# Patient Record
Sex: Male | Born: 1985 | Race: White | Hispanic: No | Marital: Single | Smoking: Current every day smoker
Health system: Southern US, Community
[De-identification: ages and names within clinical notes are randomized; demographics above are authoritative.]

## PROBLEM LIST (undated history)

## (undated) DIAGNOSIS — R011 Cardiac murmur, unspecified: Secondary | ICD-10-CM

---

## 2011-01-19 ENCOUNTER — Emergency Department (HOSPITAL_BASED_OUTPATIENT_CLINIC_OR_DEPARTMENT_OTHER)
Admission: EM | Admit: 2011-01-19 | Discharge: 2011-01-19 | Disposition: A | Discharge status: Home / Self Care | Payer: Self-pay | Attending: Emergency Medicine | Admitting: Emergency Medicine

## 2011-01-19 DIAGNOSIS — J029 Acute pharyngitis, unspecified: Secondary | ICD-10-CM | POA: Insufficient documentation

## 2011-07-28 DIAGNOSIS — B86 Scabies: Secondary | ICD-10-CM | POA: Insufficient documentation

## 2011-07-29 ENCOUNTER — Encounter: Payer: Self-pay | Admitting: *Deleted

## 2011-07-29 ENCOUNTER — Emergency Department (HOSPITAL_BASED_OUTPATIENT_CLINIC_OR_DEPARTMENT_OTHER)
Admission: EM | Admit: 2011-07-29 | Discharge: 2011-07-29 | Disposition: A | Discharge status: Home / Self Care | Payer: Self-pay | Attending: Emergency Medicine | Admitting: Emergency Medicine

## 2011-07-29 DIAGNOSIS — B86 Scabies: Secondary | ICD-10-CM

## 2011-07-29 MED ORDER — PERMETHRIN 5 % EX CREA: TOPICAL_CREAM | CUTANEOUS | Status: AC

## 2011-07-29 NOTE — ED Notes (Signed)
Pt c/o rash that has gotten progressively worse over the last 2-3 weeks.

## 2011-07-29 NOTE — ED Provider Notes (Signed)
History     CSN: [id number] Arrival date & time: 07/29/2011 12:18 AM   First MD Initiated Contact with Patient 07/29/11 0203      Chief Complaint  Patient presents with  . Rash    (Consider location/radiation/quality/duration/timing/severity/associated sxs/prior treatment) HPI  Itching and rash began 3-4 weeks ago.  Symptoms in folds of knees elbows and around waist.  No other symptoms presnet.  GF with same.   History reviewed. No pertinent past medical history.  History reviewed. No pertinent past surgical history.  No family history on file.  History  Substance Use Topics  . Smoking status: Never Smoker   . Smokeless tobacco: Not on file  . Alcohol Use: No      Review of Systems  All other systems reviewed and are negative.    Allergies  Review of patient's allergies indicates no known allergies.  Home Medications  No current outpatient prescriptions on file.  BP 140/86  Pulse 68  Temp(Src) 98.6 F (37 C) (Oral)  Resp 16  SpO2 100%  Physical Exam  Constitutional: He appears well-developed and well-nourished.  HENT:  Head: Normocephalic and atraumatic.  Skin:       Linear red areas between fingers, some eythematous areas all extremities.    ED Course  Procedures (including critical care time)  Labs Reviewed - No data to display No results found.   No diagnosis found.    MDM         Hilario Quarry, MD 07/29/11 [phone]

## 2011-11-29 ENCOUNTER — Emergency Department (HOSPITAL_BASED_OUTPATIENT_CLINIC_OR_DEPARTMENT_OTHER)
Admission: EM | Admit: 2011-11-29 | Discharge: 2011-11-29 | Disposition: A | Discharge status: Home / Self Care | Payer: Self-pay | Attending: Emergency Medicine | Admitting: Emergency Medicine

## 2011-11-29 ENCOUNTER — Encounter (HOSPITAL_BASED_OUTPATIENT_CLINIC_OR_DEPARTMENT_OTHER): Payer: Self-pay | Admitting: *Deleted

## 2011-11-29 DIAGNOSIS — X58XXXA Exposure to other specified factors, initial encounter: Secondary | ICD-10-CM | POA: Insufficient documentation

## 2011-11-29 DIAGNOSIS — S0500XA Injury of conjunctiva and corneal abrasion without foreign body, unspecified eye, initial encounter: Secondary | ICD-10-CM

## 2011-11-29 DIAGNOSIS — Y9269 Other specified industrial and construction area as the place of occurrence of the external cause: Secondary | ICD-10-CM | POA: Insufficient documentation

## 2011-11-29 DIAGNOSIS — S058X9A Other injuries of unspecified eye and orbit, initial encounter: Secondary | ICD-10-CM | POA: Insufficient documentation

## 2011-11-29 MED ORDER — CIPROFLOXACIN HCL 0.3 % OP SOLN
2.0000 [drp] | Freq: Once | OPHTHALMIC | Status: AC
  Administered 2011-11-29: 2 [drp] via OPHTHALMIC
  Filled 2011-11-29: qty 2.5

## 2011-11-29 MED ORDER — TETRACAINE HCL 0.5 % OP SOLN
1.0000 [drp] | Freq: Once | OPHTHALMIC | Status: AC
  Administered 2011-11-29: 1 [drp] via OPHTHALMIC
  Filled 2011-11-29: qty 2

## 2011-11-29 MED ORDER — KETOROLAC TROMETHAMINE 0.5 % OP SOLN: 1.0000 [drp] | Freq: Four times a day (QID) | OPHTHALMIC | Status: AC

## 2011-11-29 MED ORDER — FLUORESCEIN SODIUM 1 MG OP STRP
1.0000 | ORAL_STRIP | Freq: Once | OPHTHALMIC | Status: AC
  Administered 2011-11-29: 1 via OPHTHALMIC
  Filled 2011-11-29: qty 1

## 2011-11-29 NOTE — ED Notes (Signed)
Pt states he was weed-eating yesterday and something "flew up into his right eye" Tried flushing it out without relief

## 2011-11-29 NOTE — ED Provider Notes (Signed)
Medical screening examination/treatment/procedure(s) were performed by non-physician practitioner and as supervising physician I was immediately available for consultation/collaboration.   Dayton Bailiff, MD 11/29/11 1515

## 2011-11-29 NOTE — Discharge Instructions (Signed)
Corneal Abrasion The cornea is the clear covering at the front and center of the eye. When looking at the colored portion (iris) of the eye, you are looking through that person's cornea.  This very thin tissue is made up of many layers. The surface layer is a single layer of cells called the corneal epithelium. This is one of the most sensitive tissues in the body. If a scratch or injury causes the corneal epithelium to come off, it is called a corneal abrasion. If the injury extends to the tissues below the epithelium, the condition is called a corneal ulcer.  CAUSES   Scratches.   Trauma.   Foreign body in the eye.   Some people have recurrences of abrasions in the area of the original injury even after they heal. This is called recurrent erosion syndrome. Recurrent erosion syndromes generally improve and go away with time.  SYMPTOMS   Eye pain.   Difficulty or inability to keep the injured eye open.   The eye becomes very sensitive to light.   Recurrent erosions tend to happen suddenly, first thing in the morning - usually upon awakening and opening the eyes.  DIAGNOSIS  Your eye professional can diagnose a corneal abrasion during an eye exam. Dye is usually placed in the eye using a drop or a small paper strip moistened by the patient's tears. When the eye is examined with a special light, the abrasion shows up clearly because of the dye. TREATMENT   Small abrasions may be treated with antibiotic drops or ointment alone.   Usually a pressure patch is specially applied. Pressure patches prevent the eye from blinking, allowing the corneal epithelium to heal. Because blinking is less, a pressure patch also reduces the amount of pain present in the eye during healing. Most corneal abrasions heal within 2-3 days with no effect on vision. WARNING: Do not drive or operate machinery while your eye is patched. Your ability to judge distances is impaired.   If abrasion becomes infected and  spreads to the deeper tissues of the cornea, a corneal ulcer can result. This is serious because it can cause corneal scarring. Corneal scars interfere with light passing through the cornea, and cause a loss of vision in the involved eye.   If your caregiver has given you a follow-up appointment, it is very important to keep that appointment. Not keeping the appointment could result in a severe eye infection or permanent loss of vision. If there is any problem keeping the appointment, you must call back to this facility for assistance.  SEEK MEDICAL CARE IF:   You have pain, light sensitivity and a scratchy feeling in one eye (or both).   Your pressure patch keeps loosening up and you can blink your eye under the patch after treatment.   Any kind of discharge develops from the involved eye after treatment or if the lids stick together in the morning.   You have the same symptoms in the morning as you did with the original abrasion days, weeks or months after the abrasion healed.  MAKE SURE YOU:   Understand these instructions.   Will watch your condition.   Will get help right away if you are not doing well or get worse.  Document Released: 08/07/2000 Document Revised: 07/30/2011 Document Reviewed: 03/15/2008 ExitCare Patient Information 2012 [employer]. 

## 2011-11-29 NOTE — ED Provider Notes (Signed)
History     CSN: [id number]  Arrival date & time 11/29/11  1332   First MD Initiated Contact with Patient 11/29/11 1409      Chief Complaint  Patient presents with  . Foreign Body in Eye    (Consider location/radiation/quality/duration/timing/severity/associated sxs/prior treatment) HPI  Pt presents to the ED with complaints of scratching his eye at work yesterday. He states that he was mowing the lawn when he feels as though grass got under his goggles and in his eye. He flushed his eye for a long time with normal saline but his eye is still red and irritated. He denies change in vision or sensitivity to light. He admits to his eye itching. He denies fevers, chills, LOC, lethargy or being unable to move his eye.  History reviewed. No pertinent past medical history.  History reviewed. No pertinent past surgical history.  History reviewed. No pertinent family history.  History  Substance Use Topics  . Smoking status: Never Smoker   . Smokeless tobacco: Not on file  . Alcohol Use: No      Review of Systems  All other systems reviewed and are negative.    Allergies  Review of patient's allergies indicates no known allergies.  Home Medications   Current Outpatient Rx  Name Route Sig Dispense Refill  . KETOROLAC TROMETHAMINE 0.5 % OP SOLN Both Eyes Place 1 drop into both eyes every 6 (six) hours. 3 mL 0    BP 132/76  Pulse 102  Temp(Src) 97.8 F (36.6 C) (Oral)  Resp 18  Ht 5\' 8"  (1.727 m)  Wt 150 lb (68.04 kg)  BMI 22.81 kg/m2  SpO2 100%  Physical Exam  Nursing note and vitals reviewed. Constitutional: He appears well-developed and well-nourished. No distress.  HENT:  Head: Normocephalic and atraumatic.  Eyes: EOM and lids are normal. Pupils are equal, round, and reactive to light. No foreign bodies found. Right eye exhibits no chemosis, no discharge, no exudate and no hordeolum. No foreign body present in the right eye. Left eye exhibits no chemosis, no  discharge, no exudate and no hordeolum. No foreign body present in the left eye. Right conjunctiva is injected. Right conjunctiva has no hemorrhage. Left conjunctiva is not injected. Left conjunctiva has no hemorrhage.  Slit lamp exam:      The right eye shows corneal abrasion.         Small corneal abrasion noted with conjunctival injection. Eye lids swept for foriegn bodies with no findings.  Neck: Normal range of motion. Neck supple.  Cardiovascular: Normal rate and regular rhythm.   Pulmonary/Chest: Effort normal.  Abdominal: Soft.  Neurological: He is alert.  Skin: Skin is warm and dry.    ED Course  Procedures (including critical care time)  Labs Reviewed - No data to display No results found.   1. Corneal abrasion       MDM  Pt given Cipro eye drops in ED and an Rx for Ketorolac eye drops. Vision is unchanged from baseline  Also given a referral to Ophthalmology to be seen tomorrow or Tuesday.  Pt has been advised of the symptoms that warrant their return to the ED. Patient has voiced understanding and has agreed to follow-up with the PCP or specialist.         Dorthula Matas, PA 11/29/11 [phone]

## 2013-12-17 ENCOUNTER — Emergency Department (HOSPITAL_BASED_OUTPATIENT_CLINIC_OR_DEPARTMENT_OTHER)
Admission: EM | Admit: 2013-12-17 | Discharge: 2013-12-17 | Disposition: A | Discharge status: Home / Self Care | Payer: Self-pay | Attending: Emergency Medicine | Admitting: Emergency Medicine

## 2013-12-17 ENCOUNTER — Emergency Department (HOSPITAL_BASED_OUTPATIENT_CLINIC_OR_DEPARTMENT_OTHER): Payer: Self-pay

## 2013-12-17 ENCOUNTER — Encounter (HOSPITAL_BASED_OUTPATIENT_CLINIC_OR_DEPARTMENT_OTHER): Payer: Self-pay | Admitting: Emergency Medicine

## 2013-12-17 DIAGNOSIS — Y929 Unspecified place or not applicable: Secondary | ICD-10-CM | POA: Insufficient documentation

## 2013-12-17 DIAGNOSIS — Y939 Activity, unspecified: Secondary | ICD-10-CM | POA: Insufficient documentation

## 2013-12-17 DIAGNOSIS — X500XXA Overexertion from strenuous movement or load, initial encounter: Secondary | ICD-10-CM | POA: Insufficient documentation

## 2013-12-17 DIAGNOSIS — F17200 Nicotine dependence, unspecified, uncomplicated: Secondary | ICD-10-CM | POA: Insufficient documentation

## 2013-12-17 DIAGNOSIS — S82899A Other fracture of unspecified lower leg, initial encounter for closed fracture: Secondary | ICD-10-CM | POA: Insufficient documentation

## 2013-12-17 DIAGNOSIS — S82409A Unspecified fracture of shaft of unspecified fibula, initial encounter for closed fracture: Secondary | ICD-10-CM

## 2013-12-17 MED ORDER — HYDROCODONE-ACETAMINOPHEN 5-325 MG PO TABS: 2.0000 | ORAL_TABLET | ORAL | Status: DC | PRN

## 2013-12-17 MED ORDER — HYDROCODONE-ACETAMINOPHEN 5-325 MG PO TABS
1.0000 | ORAL_TABLET | Freq: Once | ORAL | Status: AC
  Administered 2013-12-17: 1 via ORAL
  Filled 2013-12-17: qty 1

## 2013-12-17 NOTE — ED Notes (Signed)
Reports 'twisting' right ankle on Friday.  Pain, swelling worsening.  Presents on crutches he borrowed from a friend. States pain is worse on the lateral aspect of the ankle.

## 2013-12-17 NOTE — Discharge Instructions (Signed)
Fibular Fracture, Ankle, Adult, Undisplaced, Treated With Immobilization °A simple fracture of the bone below the knee on the outside of your leg (fibula) usually heals without problems. °CAUSES °Typically, a fibular fracture occurs as a result of trauma. A blow to the side of your leg or a powerful twisting movement can cause a fracture. Fibular fractures are often seen as a result of football, soccer, or skiing injuries. °SYMPTOMS °Symptoms of a fibular fracture can include: °· Pain. °· Shortening or abnormal alignment of your lower leg (angulation). °DIAGNOSIS °A health care provider will need to examine the leg. X-ray exams will be ordered for further to confirm the fracture and evaluate the extent and of the injury. °TREATMENT  °Typically, a cast or immobilizer is applied. Sometimes a splint is placed on these fractures if it is needed for comfort or if the bones are badly out of place. Crutches may be needed to help you get around.  °HOME CARE INSTRUCTIONS  °· Apply ice to the injured area: °· Put ice in a plastic bag. °· Place a towel between your skin and the bag. °· Leave the ice on for 20 minutes, 2 3 times a day. °· Use crutches as directed. Resume walking without crutches as directed by your health care provider or when comfortable doing so. °· Only take over-the-counter or prescription medicines for pain, discomfort, or fever as directed by your health care provider. °· Keeping your leg raised may lessen swelling. °· If you have a removable splint or boot, do not remove the boot unless directed by your health care provider. °· Do not not drive a car or operate a motor vehicle until your health care provider specifically tells you it is safe to do so. °SEEK IMMEDIATE MEDICAL CARE IF:  °· Your cast gets damaged or breaks. °· You have continued severe pain or more swelling than you did before the cast was put on, or the pain is not controlled with medications. °· Your skin or nails below the injury turn  blue or grey, or feel cold or numb. °· There is a bad smell or pus coming from under the cast. °· You develop severe pain in ankle or foot. °MAKE SURE YOU:  °· Understand these instructions. °· Will watch your condition. °· Will get help right away if you are not doing well or get worse. °Document Released: 05/02/2002 Document Revised: 05/31/2013 Document Reviewed: 03/22/2013 °ExitCare® Patient Information ©2014 [employer]. ° °

## 2013-12-17 NOTE — ED Provider Notes (Signed)
CSN: [id number]     Arrival date & time 12/17/13  1423 History   First MD Initiated Contact with Patient 12/17/13 1434     Chief Complaint  Patient presents with  . Ankle Pain     HPI  Patient twisted her ankle on Friday inverted it. Painful swollen ecchymotic. Painful to bear weight presents here. Is been walking on crutches from his friend  History reviewed. No pertinent past medical history. History reviewed. No pertinent past surgical history. No family history on file. History  Substance Use Topics  . Smoking status: Current Every Day Smoker  . Smokeless tobacco: Not on file  . Alcohol Use: Yes    Review of Systems  Musculoskeletal: Positive for arthralgias and joint swelling.  Skin: Positive for color change.      Allergies  Review of patient's allergies indicates no known allergies.  Home Medications   Prior to Admission medications   Medication Sig Start Date End Date Taking? Authorizing Provider  HYDROcodone-acetaminophen (NORCO/VICODIN) 5-325 MG per tablet Take 2 tablets by mouth every 4 (four) hours as needed. 12/17/13   Rolland PorterMark Joaquim Tolen, MD   BP 159/90  Pulse 107  Temp(Src) 98 F (36.7 C) (Oral)  Resp 20  Ht 5\' 8"  (1.727 m)  Wt 180 lb (81.647 kg)  BMI 27.38 kg/m2  SpO2 98% Physical Exam  Musculoskeletal:       Feet:  Soft tissue swelling and ecchymosis over the lateral malleolus. Extends several centimeters up the lateral aspect of the fibula.    ED Course  Procedures (including critical care time) Labs Review Labs Reviewed - No data to display  Imaging Review Dg Tibia/fibula Right  12/17/2013   CLINICAL DATA:  Twisted ankle Friday  EXAM: RIGHT TIBIA AND FIBULA - 2 VIEW  COMPARISON:  Concurrently obtained radiographs of the ankle  FINDINGS: Obliquely oriented fracture through the distal fibula better demonstrated on concurrently obtained radiographs of the ankle. The mid and proximal fibula are intact. The knee joint appears unremarkable. No joint  effusion. No acute abnormality of the tibia.  IMPRESSION: 1. Obliquely oriented fracture through the distal fibula. No proximal fibular fracture or malalignment.   Electronically Signed   By: Malachy MoanHeath  McCullough M.D.   On: 12/17/2013 14:58   Dg Ankle Complete Right  12/17/2013   CLINICAL DATA:  Twisting injury with pain  EXAM: RIGHT ANKLE - COMPLETE 3+ VIEW  COMPARISON:  None.  FINDINGS: There is a nondisplaced oblique fracture of the distal fibular metaphysis. No other regional fracture.  IMPRESSION: Nondisplaced oblique fracture of the distal fibular metaphysis.   Electronically Signed   By: Paulina FusiMark  Shogry M.D.   On: 12/17/2013 14:58     EKG Interpretation None      MDM   Final diagnoses:  Fibula fracture    NWB.  Orhto f/u.  Elevate.  Crutches.  [place] prn.    Rolland PorterMark Gertrude Bucks, MD 12/17/13 1515

## 2014-01-05 ENCOUNTER — Encounter (HOSPITAL_COMMUNITY): Payer: Self-pay | Admitting: Pharmacy Technician

## 2014-01-09 ENCOUNTER — Encounter (HOSPITAL_COMMUNITY)
Admission: RE | Admit: 2014-01-09 | Discharge: 2014-01-09 | Disposition: A | Discharge status: Home / Self Care | Payer: Self-pay | Source: Ambulatory Visit | Attending: Orthopedic Surgery | Admitting: Orthopedic Surgery

## 2014-01-09 ENCOUNTER — Encounter (HOSPITAL_COMMUNITY): Payer: Self-pay

## 2014-01-09 DIAGNOSIS — Z01812 Encounter for preprocedural laboratory examination: Secondary | ICD-10-CM | POA: Insufficient documentation

## 2014-01-09 HISTORY — DX: Cardiac murmur, unspecified: R01.1

## 2014-01-09 LAB — BASIC METABOLIC PANEL
BUN: 15 mg/dL (ref 6–23)
CHLORIDE: 103 meq/L (ref 96–112)
CO2: 24 mEq/L (ref 19–32)
CREATININE: 0.89 mg/dL (ref 0.50–1.35)
Calcium: 9.7 mg/dL (ref 8.4–10.5)
GLUCOSE: 90 mg/dL (ref 70–99)
POTASSIUM: 4.3 meq/L (ref 3.7–5.3)
Sodium: 140 mEq/L (ref 137–147)

## 2014-01-09 LAB — CBC
HEMATOCRIT: 44.9 % (ref 39.0–52.0)
HEMOGLOBIN: 15.2 g/dL (ref 13.0–17.0)
MCH: 30.3 pg (ref 26.0–34.0)
MCHC: 33.9 g/dL (ref 30.0–36.0)
MCV: 89.6 fL (ref 78.0–100.0)
Platelets: 183 10*3/uL (ref 150–400)
RBC: 5.01 MIL/uL (ref 4.22–5.81)
RDW: 13.1 % (ref 11.5–15.5)
WBC: 7.7 10*3/uL (ref 4.0–10.5)

## 2014-01-09 NOTE — Progress Notes (Signed)
REQUESTED PREOP ORDERS FROM DR. HEWITT'S OFFICE.

## 2014-01-09 NOTE — Pre-Procedure Instructions (Signed)
Buckner MaltaMatthew Bresee  01/09/2014   Your procedure is scheduled on:  Thursday  01/11/14  [facility] at 1245 PM.  Call this number if you have problems the morning of surgery: [phone]   Remember:   Do not eat food or drink liquids after midnight.   Take these medicines the morning of surgery with A SIP OF WATER: HYDROCODONE IF NEEDED   Do not wear jewelry, make-up or nail polish.  Do not wear lotions, powders, or perfumes. You may wear deodorant.  Do not shave 48 hours prior to surgery. Men may shave face and neck.  Do not bring valuables to the hospital.  [facility] not [facility]                  for any belongings or valuables.               Contacts, dentures or bridgework may not be worn into surgery.  Leave suitcase in the car. After surgery it may be brought to your room.  For patients admitted to the hospital, discharge time is determined by your                treatment team.               Patients discharged the day of surgery will not be allowed to drive  home.  Name and phone number of your driver:   Special Instructions:  Special Instructions: [facility] - Preparing for Surgery  Before surgery, you can play an important role.  Because skin is not sterile, your skin needs to be as free of germs as possible.  You can reduce the number of germs on you skin by washing with CHG (chlorahexidine gluconate) soap before surgery.  CHG is an antiseptic cleaner which kills germs and bonds with the skin to continue killing germs even after washing.  Please DO NOT use if you have an allergy to CHG or antibacterial soaps.  If your skin becomes reddened/irritated stop using the CHG and inform your nurse when you arrive at Short Stay.  Do not shave (including legs and underarms) for at least 48 hours prior to the first CHG shower.  You may shave your face.  Please follow these instructions carefully:   1.  Shower with CHG Soap the night before surgery  and the morning of Surgery.  2.  If you choose to wash your hair, wash your hair first as usual with your normal shampoo.  3.  After you shampoo, rinse your hair and body thoroughly to remove the Shampoo.  4.  Use CHG as you would any other liquid soap. You can apply chg directly to the skin and wash gently with scrungie or a clean washcloth.  5.  Apply the CHG Soap to your body ONLY FROM THE NECK DOWN.  Do not use on open wounds or open sores.  Avoid contact with your eyes, ears, mouth and genitals (private parts).  Wash genitals (private parts with your normal soap.  6.  Wash thoroughly, paying special attention to the area where your surgery will be performed.  7.  Thoroughly rinse your body with warm water from the neck down.  8.  DO NOT shower/wash with your normal soap after using and rinsing off the CHG Soap.  9.  Pat yourself dry with a clean towel.            10.  Wear clean pajamas.  11.  Place clean sheets on your bed the night of your first shower and do not sleep with pets.  Day of Surgery  Do not apply any lotions/deodorants the morning of surgery.  Please wear clean clothes to the hospital/surgery center.   Please read over the following fact sheets that you were given: Pain Booklet, Coughing and Deep Breathing, MRSA Information and Surgical Site Infection Prevention

## 2014-01-10 ENCOUNTER — Other Ambulatory Visit: Payer: Self-pay | Admitting: Orthopedic Surgery

## 2014-01-11 ENCOUNTER — Encounter (HOSPITAL_COMMUNITY): Payer: Self-pay | Admitting: Anesthesiology

## 2014-01-11 ENCOUNTER — Ambulatory Visit (HOSPITAL_COMMUNITY)
Admission: RE | Admit: 2014-01-11 | Discharge: 2014-01-11 | Disposition: A | Discharge status: Home / Self Care | Payer: Self-pay | Source: Ambulatory Visit | Attending: Orthopedic Surgery | Admitting: Orthopedic Surgery

## 2014-01-11 ENCOUNTER — Encounter (HOSPITAL_COMMUNITY)
Admission: RE | Disposition: A | Discharge status: Home / Self Care | Payer: Self-pay | Source: Ambulatory Visit | Attending: Orthopedic Surgery

## 2014-01-11 ENCOUNTER — Encounter (HOSPITAL_COMMUNITY): Payer: Self-pay | Admitting: *Deleted

## 2014-01-11 ENCOUNTER — Ambulatory Visit (HOSPITAL_COMMUNITY): Payer: Self-pay

## 2014-01-11 ENCOUNTER — Ambulatory Visit (HOSPITAL_COMMUNITY): Payer: Self-pay | Admitting: Anesthesiology

## 2014-01-11 DIAGNOSIS — F17200 Nicotine dependence, unspecified, uncomplicated: Secondary | ICD-10-CM | POA: Insufficient documentation

## 2014-01-11 DIAGNOSIS — Y929 Unspecified place or not applicable: Secondary | ICD-10-CM | POA: Insufficient documentation

## 2014-01-11 DIAGNOSIS — S8263XA Displaced fracture of lateral malleolus of unspecified fibula, initial encounter for closed fracture: Secondary | ICD-10-CM | POA: Insufficient documentation

## 2014-01-11 DIAGNOSIS — S82891A Other fracture of right lower leg, initial encounter for closed fracture: Secondary | ICD-10-CM

## 2014-01-11 DIAGNOSIS — X500XXA Overexertion from strenuous movement or load, initial encounter: Secondary | ICD-10-CM | POA: Insufficient documentation

## 2014-01-11 HISTORY — PX: ORIF ANKLE FRACTURE: SHX5408

## 2014-01-11 SURGERY — OPEN REDUCTION INTERNAL FIXATION (ORIF) ANKLE FRACTURE
Anesthesia: General | Site: Ankle | Laterality: Right

## 2014-01-11 MED ORDER — FENTANYL CITRATE 0.05 MG/ML IJ SOLN
INTRAMUSCULAR | Status: AC
  Filled 2014-01-11: qty 5

## 2014-01-11 MED ORDER — CEFAZOLIN SODIUM-DEXTROSE 2-3 GM-% IV SOLR
INTRAVENOUS | Status: AC
  Filled 2014-01-11: qty 50

## 2014-01-11 MED ORDER — FENTANYL CITRATE 0.05 MG/ML IJ SOLN
INTRAMUSCULAR | Status: DC | PRN
  Administered 2014-01-11 (×2): 50 ug via INTRAVENOUS
  Administered 2014-01-11 (×3): 100 ug via INTRAVENOUS

## 2014-01-11 MED ORDER — PROPOFOL 10 MG/ML IV BOLUS
INTRAVENOUS | Status: DC | PRN
  Administered 2014-01-11: 300 mg via INTRAVENOUS

## 2014-01-11 MED ORDER — MIDAZOLAM HCL 2 MG/2ML IJ SOLN: 0.5000 mg | Freq: Once | INTRAMUSCULAR | Status: DC | PRN

## 2014-01-11 MED ORDER — CHLORHEXIDINE GLUCONATE 4 % EX LIQD
60.0000 mL | Freq: Once | CUTANEOUS | Status: DC
  Filled 2014-01-11: qty 60

## 2014-01-11 MED ORDER — LACTATED RINGERS IV SOLN
INTRAVENOUS | Status: DC | PRN
  Administered 2014-01-11 (×2): via INTRAVENOUS

## 2014-01-11 MED ORDER — BACITRACIN ZINC 500 UNIT/GM EX OINT
TOPICAL_OINTMENT | CUTANEOUS | Status: DC | PRN
  Administered 2014-01-11: 1 via TOPICAL

## 2014-01-11 MED ORDER — OXYCODONE HCL 5 MG PO TABS
ORAL_TABLET | ORAL | Status: AC
  Filled 2014-01-11: qty 2

## 2014-01-11 MED ORDER — OXYCODONE HCL 5 MG PO TABS
5.0000 mg | ORAL_TABLET | ORAL | Status: DC | PRN
  Administered 2014-01-11: 10 mg via ORAL

## 2014-01-11 MED ORDER — RIVAROXABAN 10 MG PO TABS: 10.0000 mg | ORAL_TABLET | Freq: Every day | ORAL | Status: AC

## 2014-01-11 MED ORDER — OXYCODONE HCL 5 MG PO TABS: 5.0000 mg | ORAL_TABLET | Freq: Once | ORAL | Status: DC | PRN

## 2014-01-11 MED ORDER — LACTATED RINGERS IV SOLN
INTRAVENOUS | Status: DC
  Administered 2014-01-11: 13:00:00 via INTRAVENOUS

## 2014-01-11 MED ORDER — BUPIVACAINE-EPINEPHRINE (PF) 0.5% -1:200000 IJ SOLN
INTRAMUSCULAR | Status: DC | PRN
  Administered 2014-01-11: 20 mL

## 2014-01-11 MED ORDER — PROPOFOL 10 MG/ML IV BOLUS
INTRAVENOUS | Status: AC
  Filled 2014-01-11: qty 20

## 2014-01-11 MED ORDER — SODIUM CHLORIDE 0.9 % IV SOLN: INTRAVENOUS | Status: DC

## 2014-01-11 MED ORDER — OXYCODONE HCL 5 MG/5ML PO SOLN: 5.0000 mg | Freq: Once | ORAL | Status: DC | PRN

## 2014-01-11 MED ORDER — ONDANSETRON HCL 4 MG/2ML IJ SOLN
INTRAMUSCULAR | Status: DC | PRN
  Administered 2014-01-11: 4 mg via INTRAVENOUS

## 2014-01-11 MED ORDER — MEPERIDINE HCL 25 MG/ML IJ SOLN: 6.2500 mg | INTRAMUSCULAR | Status: DC | PRN

## 2014-01-11 MED ORDER — MIDAZOLAM HCL 2 MG/2ML IJ SOLN
INTRAMUSCULAR | Status: AC
  Filled 2014-01-11: qty 2

## 2014-01-11 MED ORDER — ACETAMINOPHEN 500 MG PO TABS
1000.0000 mg | ORAL_TABLET | Freq: Once | ORAL | Status: AC
  Administered 2014-01-11: 1000 mg via ORAL

## 2014-01-11 MED ORDER — PROMETHAZINE HCL 25 MG/ML IJ SOLN: 6.2500 mg | INTRAMUSCULAR | Status: DC | PRN

## 2014-01-11 MED ORDER — BACITRACIN ZINC 500 UNIT/GM EX OINT
TOPICAL_OINTMENT | CUTANEOUS | Status: AC
  Filled 2014-01-11: qty 15

## 2014-01-11 MED ORDER — ONDANSETRON HCL 4 MG/2ML IJ SOLN
INTRAMUSCULAR | Status: AC
  Filled 2014-01-11: qty 2

## 2014-01-11 MED ORDER — LIDOCAINE HCL (CARDIAC) 20 MG/ML IV SOLN
INTRAVENOUS | Status: AC
  Filled 2014-01-11: qty 5

## 2014-01-11 MED ORDER — ROCURONIUM BROMIDE 50 MG/5ML IV SOLN
INTRAVENOUS | Status: AC
  Filled 2014-01-11: qty 1

## 2014-01-11 MED ORDER — ACETAMINOPHEN 325 MG PO TABS
ORAL_TABLET | ORAL | Status: AC
  Administered 2014-01-11: 1000 mg via ORAL
  Filled 2014-01-11: qty 3

## 2014-01-11 MED ORDER — CEFAZOLIN SODIUM-DEXTROSE 2-3 GM-% IV SOLR
2.0000 g | INTRAVENOUS | Status: AC
  Administered 2014-01-11: 2 g via INTRAVENOUS

## 2014-01-11 MED ORDER — MIDAZOLAM HCL 5 MG/5ML IJ SOLN
INTRAMUSCULAR | Status: DC | PRN
  Administered 2014-01-11: 2 mg via INTRAVENOUS

## 2014-01-11 MED ORDER — BUPIVACAINE-EPINEPHRINE (PF) 0.5% -1:200000 IJ SOLN
INTRAMUSCULAR | Status: AC
  Filled 2014-01-11: qty 30

## 2014-01-11 MED ORDER — LIDOCAINE HCL (CARDIAC) 20 MG/ML IV SOLN
INTRAVENOUS | Status: DC | PRN
  Administered 2014-01-11: 80 mg via INTRAVENOUS

## 2014-01-11 MED ORDER — OXYCODONE HCL 5 MG PO TABS: 5.0000 mg | ORAL_TABLET | ORAL | Status: AC | PRN

## 2014-01-11 MED ORDER — HYDROMORPHONE HCL PF 1 MG/ML IJ SOLN
INTRAMUSCULAR | Status: AC
  Filled 2014-01-11: qty 1

## 2014-01-11 MED ORDER — HYDROMORPHONE HCL PF 1 MG/ML IJ SOLN
0.2500 mg | INTRAMUSCULAR | Status: DC | PRN
  Administered 2014-01-11: 1 mg via INTRAVENOUS

## 2014-01-11 SURGICAL SUPPLY — 64 items
BANDAGE ESMARK 6X9 LF (GAUZE/BANDAGES/DRESSINGS) ×1 IMPLANT
BIT DRILL 2.5X2.75 QC CALB (BIT) ×3 IMPLANT
BIT DRILL 3.5X5.5 QC CALB (BIT) ×3 IMPLANT
BLADE SURG 15 STRL LF DISP TIS (BLADE) ×1 IMPLANT
BLADE SURG 15 STRL SS (BLADE) ×2
BNDG COHESIVE 4X5 TAN STRL (GAUZE/BANDAGES/DRESSINGS) ×3 IMPLANT
BNDG COHESIVE 6X5 TAN STRL LF (GAUZE/BANDAGES/DRESSINGS) ×3 IMPLANT
BNDG ESMARK 6X9 LF (GAUZE/BANDAGES/DRESSINGS) ×3
CANISTER SUCT 3000ML (MISCELLANEOUS) ×3 IMPLANT
CHLORAPREP W/TINT 26ML (MISCELLANEOUS) ×3 IMPLANT
COVER SURGICAL LIGHT HANDLE (MISCELLANEOUS) ×3 IMPLANT
CUFF TOURNIQUET SINGLE 34IN LL (TOURNIQUET CUFF) ×3 IMPLANT
CUFF TOURNIQUET SINGLE 44IN (TOURNIQUET CUFF) IMPLANT
DRAPE C-ARM 42X72 X-RAY (DRAPES) ×3 IMPLANT
DRAPE C-ARMOR (DRAPES) ×3 IMPLANT
DRAPE U-SHAPE 47X51 STRL (DRAPES) ×3 IMPLANT
DRSG ADAPTIC 3X8 NADH LF (GAUZE/BANDAGES/DRESSINGS) ×3 IMPLANT
DRSG PAD ABDOMINAL 8X10 ST (GAUZE/BANDAGES/DRESSINGS) ×3 IMPLANT
ELECT REM PT RETURN 9FT ADLT (ELECTROSURGICAL) ×3
ELECTRODE REM PT RTRN 9FT ADLT (ELECTROSURGICAL) ×1 IMPLANT
GLOVE BIO SURGEON STRL SZ7 (GLOVE) ×3 IMPLANT
GLOVE BIO SURGEON STRL SZ8 (GLOVE) ×3 IMPLANT
GLOVE BIOGEL PI IND STRL 7.5 (GLOVE) ×1 IMPLANT
GLOVE BIOGEL PI IND STRL 8 (GLOVE) ×1 IMPLANT
GLOVE BIOGEL PI INDICATOR 7.5 (GLOVE) ×2
GLOVE BIOGEL PI INDICATOR 8 (GLOVE) ×2
GOWN STRL REUS W/ TWL LRG LVL3 (GOWN DISPOSABLE) ×2 IMPLANT
GOWN STRL REUS W/ TWL XL LVL3 (GOWN DISPOSABLE) ×1 IMPLANT
GOWN STRL REUS W/TWL LRG LVL3 (GOWN DISPOSABLE) ×4
GOWN STRL REUS W/TWL XL LVL3 (GOWN DISPOSABLE) ×2
KIT BASIN OR (CUSTOM PROCEDURE TRAY) ×3 IMPLANT
KIT ROOM TURNOVER OR (KITS) ×3 IMPLANT
NEEDLE 22X1 1/2 (OR ONLY) (NEEDLE) IMPLANT
NS IRRIG 1000ML POUR BTL (IV SOLUTION) ×3 IMPLANT
PACK ORTHO EXTREMITY (CUSTOM PROCEDURE TRAY) ×3 IMPLANT
PAD ARMBOARD 7.5X6 YLW CONV (MISCELLANEOUS) ×6 IMPLANT
PAD CAST 4YDX4 CTTN HI CHSV (CAST SUPPLIES) ×1 IMPLANT
PADDING CAST COTTON 4X4 STRL (CAST SUPPLIES) ×2
PADDING CAST COTTON 6X4 STRL (CAST SUPPLIES) ×3 IMPLANT
PLATE 6H 3.5 143556 (Plate) ×3 IMPLANT
SCREW CORT 3.5X16 815037016 (Screw) ×3 IMPLANT
SCREW CORTICAL 3.5MM  12MM (Screw) ×2 IMPLANT
SCREW CORTICAL 3.5MM  20MM (Screw) ×2 IMPLANT
SCREW CORTICAL 3.5MM 12MM (Screw) ×1 IMPLANT
SCREW CORTICAL 3.5MM 14MM (Screw) ×9 IMPLANT
SCREW CORTICAL 3.5MM 20MM (Screw) ×1 IMPLANT
SPLINT PLASTER CAST XFAST 5X30 (CAST SUPPLIES) ×2 IMPLANT
SPLINT PLASTER XFAST SET 5X30 (CAST SUPPLIES) ×4
SPONGE GAUZE 4X4 12PLY (GAUZE/BANDAGES/DRESSINGS) ×3 IMPLANT
SPONGE LAP 18X18 X RAY DECT (DISPOSABLE) ×3 IMPLANT
STAPLER VISISTAT 35W (STAPLE) IMPLANT
SUCTION FRAZIER TIP 10 FR DISP (SUCTIONS) ×3 IMPLANT
SUT MNCRL AB 3-0 PS2 18 (SUTURE) IMPLANT
SUT PROLENE 3 0 PS 2 (SUTURE) ×3 IMPLANT
SUT VIC AB 2-0 CT1 27 (SUTURE) ×4
SUT VIC AB 2-0 CT1 TAPERPNT 27 (SUTURE) ×2 IMPLANT
SUT VIC AB 3-0 PS2 18 (SUTURE) ×2
SUT VIC AB 3-0 PS2 18XBRD (SUTURE) ×1 IMPLANT
SYR CONTROL 10ML LL (SYRINGE) IMPLANT
TOWEL OR 17X24 6PK STRL BLUE (TOWEL DISPOSABLE) ×3 IMPLANT
TOWEL OR 17X26 10 PK STRL BLUE (TOWEL DISPOSABLE) ×3 IMPLANT
TUBE CONNECTING 12'X1/4 (SUCTIONS) ×1
TUBE CONNECTING 12X1/4 (SUCTIONS) ×2 IMPLANT
WATER STERILE IRR 1000ML POUR (IV SOLUTION) ×3 IMPLANT

## 2014-01-11 NOTE — Progress Notes (Signed)
Orthopedic Tech Progress Note Patient Details:  Tony Bell 1986-03-28 [id number]  Casting Cast Intervention: Removal     Mickie BailJennifer Carol Cammer 01/11/2014, 3:13 PM

## 2014-01-11 NOTE — Anesthesia Preprocedure Evaluation (Addendum)
Anesthesia Evaluation  Patient identified by MRN, date of birth, ID band Patient awake    Reviewed: Allergy & Precautions, H&P , NPO status , Patient's Chart, lab work & pertinent test results  History of Anesthesia Complications Negative for: history of anesthetic complications  Airway Mallampati: II TM Distance: >3 FB Neck ROM: Full    Dental  (+) Teeth Intact, Dental Advisory Given   Pulmonary Current Smoker,  breath sounds clear to auscultation        Cardiovascular negative cardio ROS  Rhythm:Regular Rate:Normal     Neuro/Psych negative neurological ROS  negative psych ROS   GI/Hepatic negative GI ROS, Neg liver ROS,   Endo/Other  negative endocrine ROS  Renal/GU negative Renal ROS     Musculoskeletal   Abdominal   Peds  Hematology   Anesthesia Other Findings   Reproductive/Obstetrics negative OB ROS                          Anesthesia Physical Anesthesia Plan  ASA: II  Anesthesia Plan: General   Post-op Pain Management:    Induction:   Airway Management Planned: LMA  Additional Equipment:   Intra-op Plan:   Post-operative Plan: Extubation in OR  Informed Consent: I have reviewed the patients History and Physical, chart, labs and discussed the procedure including the risks, benefits and alternatives for the proposed anesthesia with the patient or authorized representative who has indicated his/her understanding and acceptance.   Dental advisory given  Plan Discussed with: CRNA, Anesthesiologist and Surgeon  Anesthesia Plan Comments:         Anesthesia Quick Evaluation

## 2014-01-11 NOTE — H&P (Signed)
Tony Bell is an 28 y.o. male.   Chief Complaint: right ankle fracture HPI:  28 y/o male with PMH of smoking fractured his right ankle a month ago while "play fighting" with some friends.  He has a displaced lateral malleolus fracture and widened mortise.  He presents now for operative fixation of this fracture.  Past Medical History  Diagnosis Date  . Heart murmur     AS CHILD    History reviewed. No pertinent past surgical history.  History reviewed. No pertinent family history. Social History:  reports that he has been smoking.  He does not have any smokeless tobacco history on file. He reports that he drinks alcohol. He reports that he does not use illicit drugs.  Allergies: No Known Allergies  Medications Prior to Admission  Medication Sig Dispense Refill  . HYDROcodone-acetaminophen (NORCO/VICODIN) 5-325 MG per tablet Take 2 tablets by mouth every 6 (six) hours as needed for moderate pain.        No results found for this or any previous visit (from the past 48 hour(s)). No results found.  ROS  No recent f/c/n/v/wt loss  Blood pressure 136/96, pulse 92, temperature 97.9 F (36.6 C), resp. rate 20, height 5\' 8"  (1.727 m), weight 90.266 kg (199 lb), SpO2 98.00%. Physical Exam  wn wd male in nad.  A and o x 4.  Mood and affect normal.  EOMI.  Resp unlabored.  Right ankle with healthy skin and palpable pulses.  Normal sens to LT dorsally and plantarly at the foot.  5/5 strength in PF and DF of the toes.  Assessment/Plan Displaced lateral malleolus fracture - to OR for ORIF.  The risks and benefits of the alternative treatment options have been discussed in detail.  The patient wishes to proceed with surgery and specifically understands risks of bleeding, infection, nerve damage, blood clots, need for additional surgery, amputation and death.   Tony Bell 01/11/2014, 3:11 PM

## 2014-01-11 NOTE — Anesthesia Procedure Notes (Signed)
Procedure Name: LMA Insertion Date/Time: 01/11/2014 3:28 PM Performed by: Romie MinusOCK, Renan Danese K Pre-anesthesia Checklist: Patient identified, Emergency Drugs available, Suction available, Patient being monitored and Timeout performed Patient Re-evaluated:Patient Re-evaluated prior to inductionOxygen Delivery Method: Circle system utilized Preoxygenation: Pre-oxygenation with 100% oxygen Intubation Type: IV induction Ventilation: Mask ventilation without difficulty LMA: LMA inserted LMA Size: 4.0 Number of attempts: 1 Placement Confirmation: positive ETCO2,  CO2 detector and breath sounds checked- equal and bilateral Tube secured with: Tape Dental Injury: Teeth and Oropharynx as per pre-operative assessment

## 2014-01-11 NOTE — Discharge Instructions (Signed)
Toni ArthursJohn Sondos Wolfman, MD [facility]  Please read the following information regarding your care after surgery.  Medications  You only need a prescription for the narcotic pain medicine (ex. oxycodone, Percocet, Norco).  All of the other medicines listed below are available over the counter. X acetominophen (Tylenol) 650 mg every 4-6 hours as you need for minor pain X oxycodone as prescribed for moderate to severe pain ?   Narcotic pain medicine (ex. oxycodone, Percocet, Vicodin) will cause constipation.  To prevent this problem, take the following medicines while you are taking any pain medicine. X docusate sodium (Colace) 100 mg twice a day X senna (Senokot) 2 tablets twice a day  X To help prevent blood clots, take Xarelto once a day for two weeks after surgery.  You should also get up every hour while you are awake to move around.    Weight Bearing ? Bear weight when you are able on your operated leg or foot. ? Bear weight only on the heel of your operated foot in the post-op shoe. X Do not bear any weight on the operated leg or foot.  Cast / Splint / Dressing X Keep your splint or cast clean and dry.  Dont put anything (coat hanger, pencil, etc) down inside of it.  If it gets damp, use a hair dryer on the cool setting to dry it.  If it gets soaked, call the office to schedule an appointment for a cast change. ? Remove your dressing 3 days after surgery and cover the incisions with dry dressings.    After your dressing, cast or splint is removed; you may shower, but do not soak or scrub the wound.  Allow the water to run over it, and then gently pat it dry.  Swelling It is normal for you to have swelling where you had surgery.  To reduce swelling and pain, keep your toes above your nose for at least 3 days after surgery.  It may be necessary to keep your foot or leg elevated for several weeks.  If it hurts, it should be elevated.  Follow Up Call my office at [phone] when  you are discharged from the hospital or surgery center to schedule an appointment to be seen two weeks after surgery.  Call my office at [phone] if you develop a fever >101.5 F, nausea, vomiting, bleeding from the surgical site or severe pain.

## 2014-01-11 NOTE — Transfer of Care (Signed)
Immediate Anesthesia Transfer of Care Note  Patient: Tony Bell  Procedure(s) Performed: Procedure(s): OPEN REDUCTION INTERNAL FIXATION (ORIF) OF RIGHT ANKLE  (Right)  Patient Location: PACU  Anesthesia Type:General  Level of Consciousness: awake, alert , oriented and patient cooperative  Airway & Oxygen Therapy: Patient Spontanous Breathing and Patient connected to nasal cannula oxygen  Post-op Assessment: Report given to PACU RN and Post -op Vital signs reviewed and stable  Post vital signs: Reviewed  Complications: No apparent anesthesia complications

## 2014-01-11 NOTE — Brief Op Note (Signed)
01/11/2014  4:24 PM  PATIENT:  Tony MaltaMatthew Willard  28 y.o. male  PRE-OPERATIVE DIAGNOSIS:  Right ankle lateral malleolus fracture  POST-OPERATIVE DIAGNOSIS:  same  Procedure(s): 1.  ORIF right ankle lateral malleolus fracture 2.  Stress exam of right ankle under fluoro   SURGEON:  Toni ArthursJohn Hayes Czaja, MD  ASSISTANT: n/a  ANESTHESIA:   General  EBL:  minimal   TOURNIQUET:  30 min at 250 mm Hg  COMPLICATIONS:  None apparent  DISPOSITION:  Extubated, awake and stable to recovery.  DICTATION ID:  [id number]

## 2014-01-11 NOTE — Progress Notes (Signed)
Orthopedic Tech Progress Note Patient Details:  Buckner MaltaMatthew Fickett February 03, 1986 [id number]  Ortho Devices Type of Ortho Device: Postop shoe/boot Ortho Device/Splint Interventions: Ordered   Jennye Moccasinnthony Craig Teletha Petrea 01/11/2014, 5:22 PM

## 2014-01-12 ENCOUNTER — Encounter (HOSPITAL_COMMUNITY): Payer: Self-pay | Admitting: Orthopedic Surgery

## 2014-01-12 NOTE — Op Note (Signed)
NAMBuckner Malta:  Tony Bell, Tony Bell              ACCOUNT NO.:  [id number]  MEDICAL RECORD NO.:  [id number]  LOCATION:  [facility]                         FACILITY:  [facility]  PHYSICIAN:  Toni ArthursJohn Ruvim Risko, MD        DATE OF BIRTH:  08/08/1986  DATE OF PROCEDURE:  01/11/2014 DATE OF DISCHARGE:  01/11/2014                              OPERATIVE REPORT   PREOPERATIVE DIAGNOSIS:  Right ankle lateral malleolus fracture.  POSTOPERATIVE DIAGNOSIS:  Right ankle lateral malleolus fracture.  PROCEDURE: 1. Open reduction and internal fixation of right ankle lateral     malleolus fracture. 2. Stress examination of the right ankle under fluoroscopy.  SURGEON:  Toni ArthursJohn Jarold Macomber, MD.  ANESTHESIA:  General.  ESTIMATED BLOOD LOSS:  Minimal.  TOURNIQUET TIME:  30 minutes at 250 mmHg.  COMPLICATIONS:  None apparent.  DISPOSITION:  Extubated, awake, and stable to recovery.  INDICATIONS FOR PROCEDURE:  The patient is a 28 year old male who was "horsing around" with some friends approximately 4 weeks ago when he injured his right ankle.  He was noted to have a widened ankle mortise and a laterally displaced distal fibula.  He presents now for operative treatment of this displaced ankle fracture.  He understands the risks and benefits, the alternative treatment options and elects surgical treatment.  He specifically understands risks of bleeding, infection, nerve damage, blood clots, need for additional surgery, continued pain, amputation, and death.  PROCEDURE IN DETAIL:  After preoperative consent was obtained and the correct operative site was identified, the patient was brought to the operating room and placed supine on the operating table.  General anesthesia was induced.  Preoperative antibiotics were administered. Surgical time-out was taken.  The right lower extremity was prepped and draped in standard sterile fashion, the tourniquet around the thigh. The extremity was exsanguinated.  The tourniquet was inflated to  250 mmHg.  A longitudinal incision was made over the lateral malleolus. Sharp dissection was carried down through the skin and subcutaneous tissue.  Fracture site was identified.  It was mobilized and cleaned of all hematoma that was irrigated.  The fracture was reduced and held with a lobster claw clamp.  A 3.5-mm fully-threaded lag screw was inserted from posterior to anterior across the fracture site.  It was noted the compressive fracture site appropriately.  A 6-hole 1/3 tubular plate was then contoured to fit the lateral malleolus.  It was fixed distally with 2 unicortical screws and proximally with 3 bicortical screws.  AP, mortise, and lateral radiographs confirmed appropriate reduction of the fracture and appropriate position and length of all hardware.  The mortise view was obtained.  Dorsiflexion and external rotation stress was applied.  There was no widening of the ankle mortise with syndesmoses.  Wound was irrigated.  The deep subcutaneous tissues and periosteum were repaired over the plate with inverted simple sutures of 2-0 Vicryl.  A 3-0 Monocryl was then used to approximate the subcutaneous layer.  Running 3-0 Prolene was used to close the skin incision.  Sterile dressings were applied followed by a well-padded short-leg splint.  The subcutaneous tissue at the incision was infiltrated with 0.5% Marcaine with epinephrine after closure.  The patient was then awakened  from anesthesia and transported to the recovery room in stable condition.  FOLLOWUP PLAN:  The patient will be nonweightbearing on the right lower extremity.  He will follow up with me in 2 weeks for suture removal. Hopefully, we will get him weightbearing in a month and a CAM walker boot.     Toni Arthurs, MD     JH/MEDQ  D:  01/11/2014  T:  01/12/2014  Job:  [id number]

## 2014-01-12 NOTE — Anesthesia Postprocedure Evaluation (Signed)
  Anesthesia Post-op Note  Patient: Tony Bell  Procedure(s) Performed: Procedure(s): OPEN REDUCTION INTERNAL FIXATION (ORIF) OF RIGHT ANKLE  (Right)  Patient Location: PACU  Anesthesia Type:General  Level of Consciousness: awake  Airway and Oxygen Therapy: Patient Spontanous Breathing  Post-op Pain: mild  Post-op Assessment: Post-op Vital signs reviewed  Post-op Vital Signs: Reviewed  Last Vitals:  Filed Vitals:   01/11/14 1700  BP: 140/66  Pulse: 70  Temp: 36.6 C  Resp: 16    Complications: No apparent anesthesia complications

## 2015-03-25 IMAGING — CR DG TIBIA/FIBULA 2V*R*
4 series · 4 of 4 positions shown · non-contrast
Comparison: Concurrently obtained radiographs of the ankle

CLINICAL DATA: Twisted ankle [REDACTED]

EXAM:
RIGHT TIBIA AND FIBULA - 2 VIEW

[t tib/fib ap right (1 of 2)]
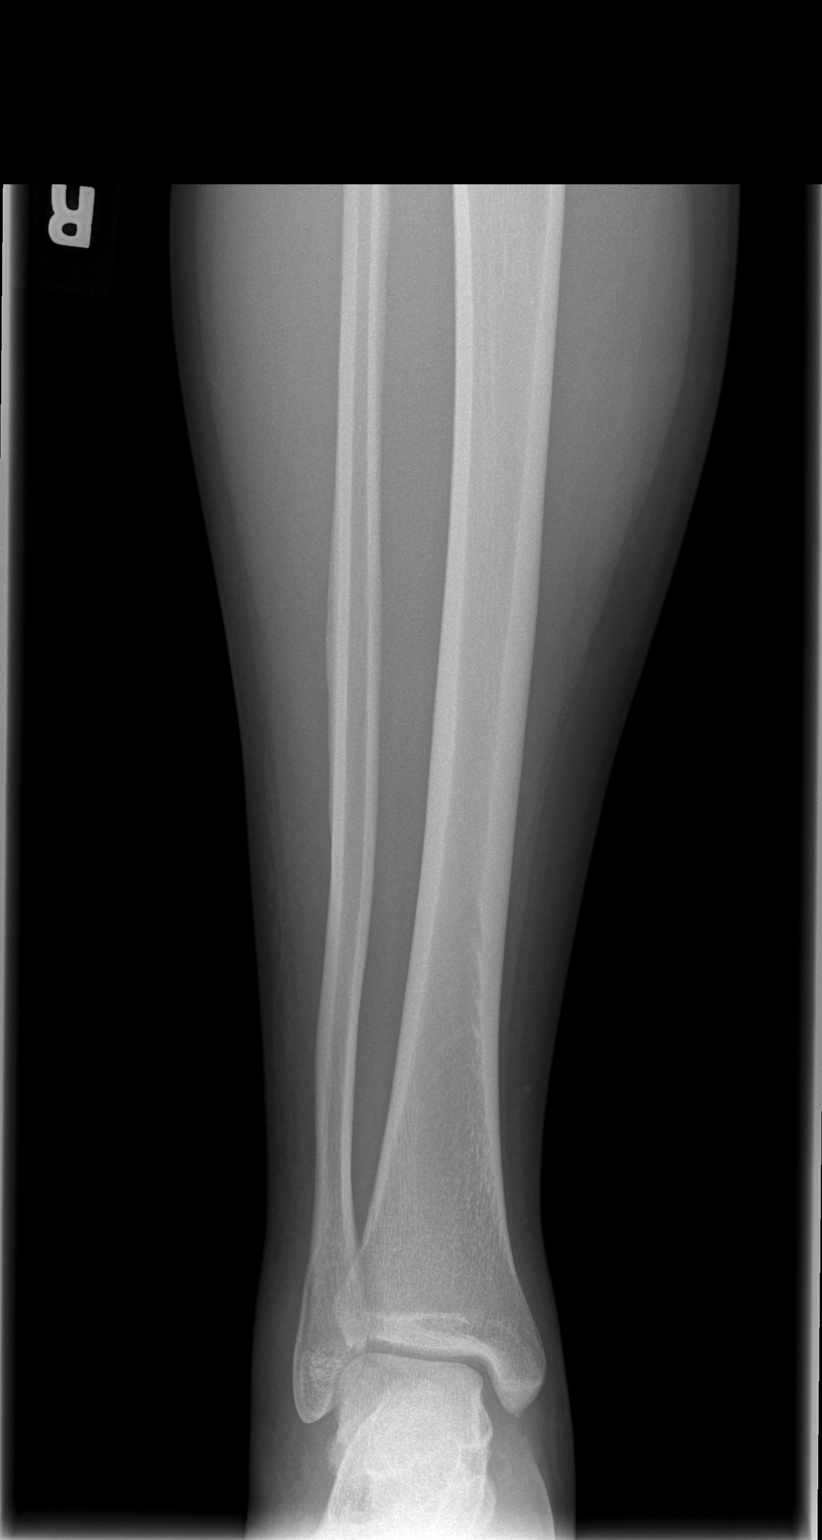

[t tib/fib ap right (2 of 2)]
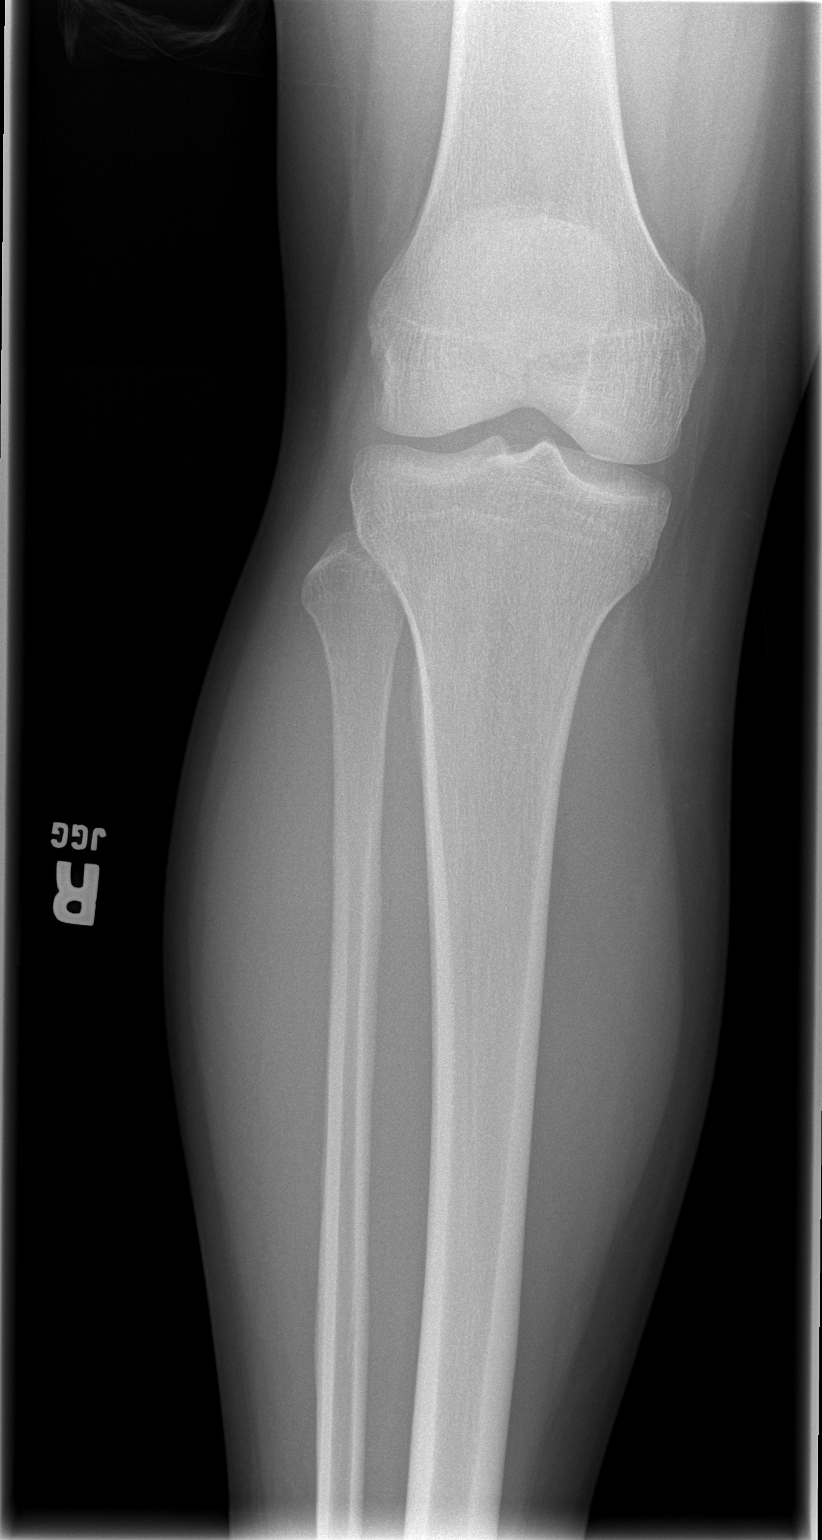

[t tib/fib lat right (1 of 2)]
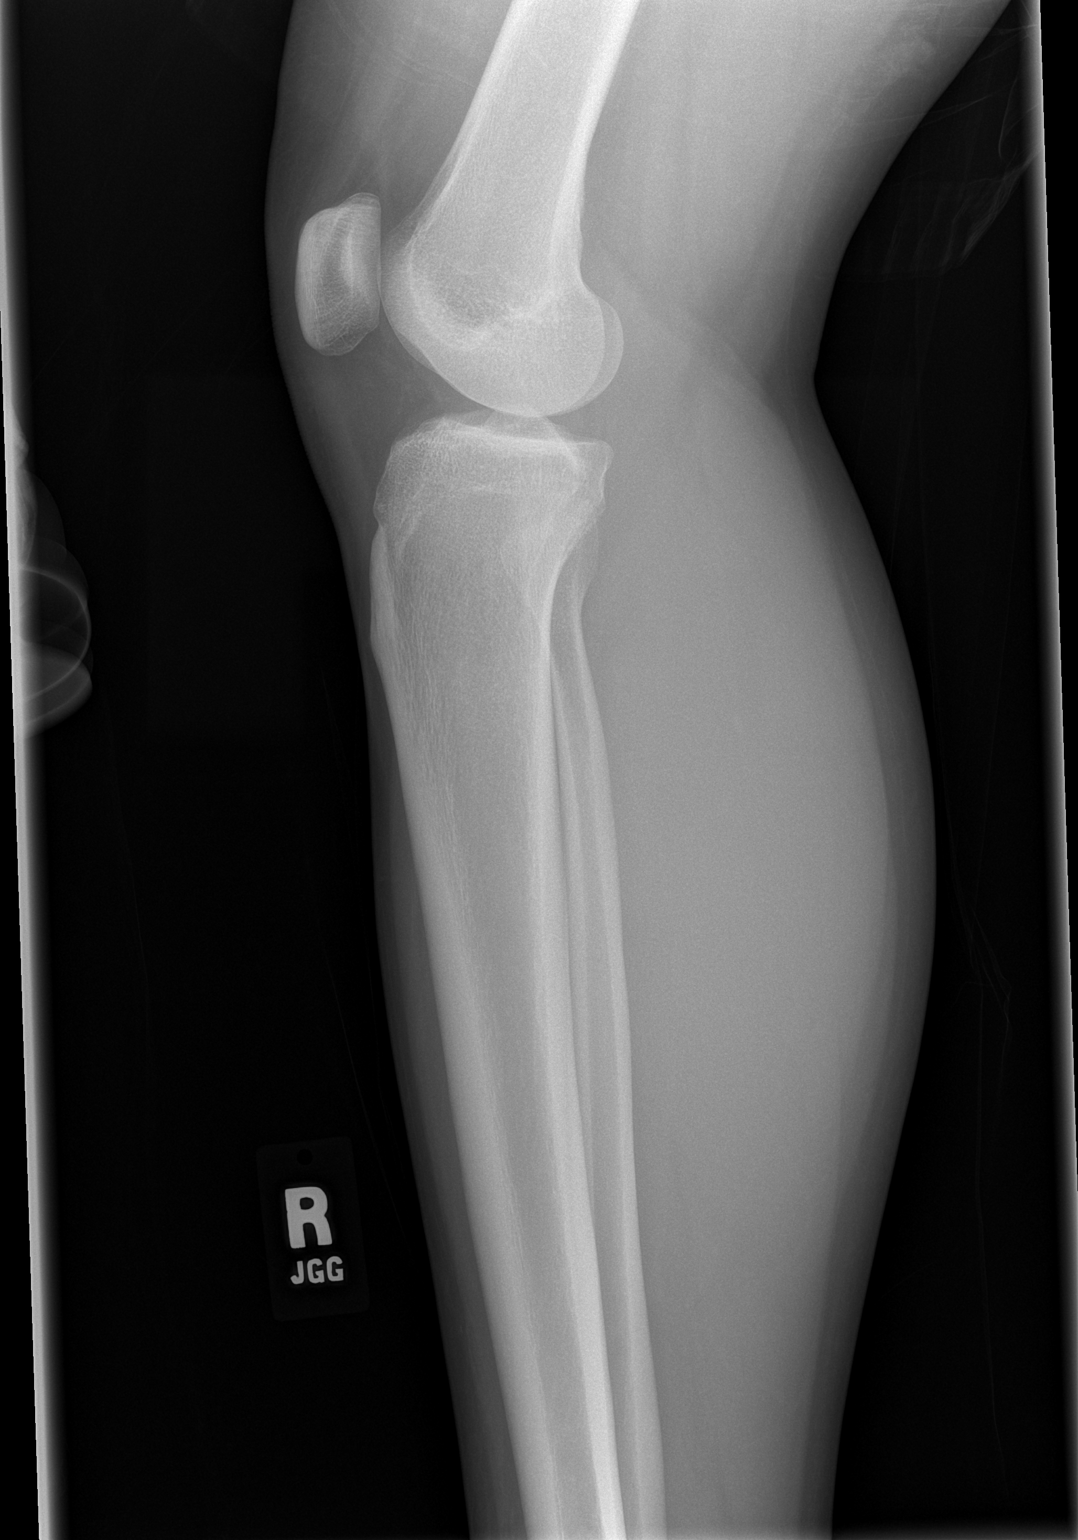

[t tib/fib lat right (2 of 2)]
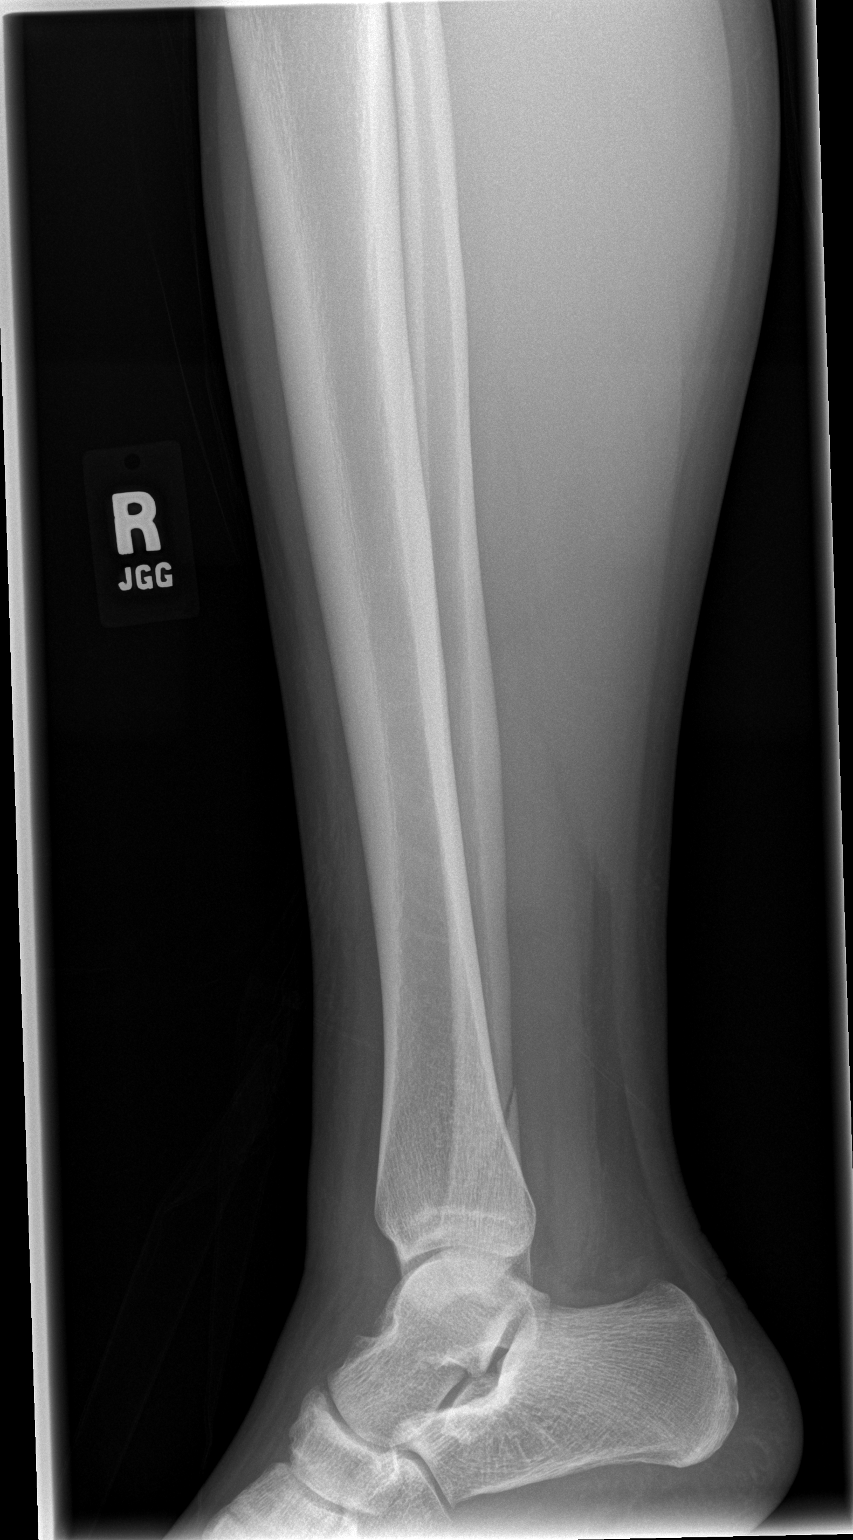

[4 of 4 positions shown; findings below may reference images not displayed]

FINDINGS: Obliquely oriented fracture through the distal fibula better
demonstrated on concurrently obtained radiographs of the ankle. The
mid and proximal fibula are intact. The knee joint appears
unremarkable. No joint effusion. No acute abnormality of the tibia.
IMPRESSION: 1. Obliquely oriented fracture through the distal fibula. No
proximal fibular fracture or malalignment.

## 2015-03-25 IMAGING — CR DG ANKLE COMPLETE 3+V*R*
3 series · 3 of 3 positions shown · non-contrast
Comparison: None.

CLINICAL DATA: Twisting injury with pain

EXAM:
RIGHT ANKLE - COMPLETE 3+ VIEW

[t ankle joint ap right]
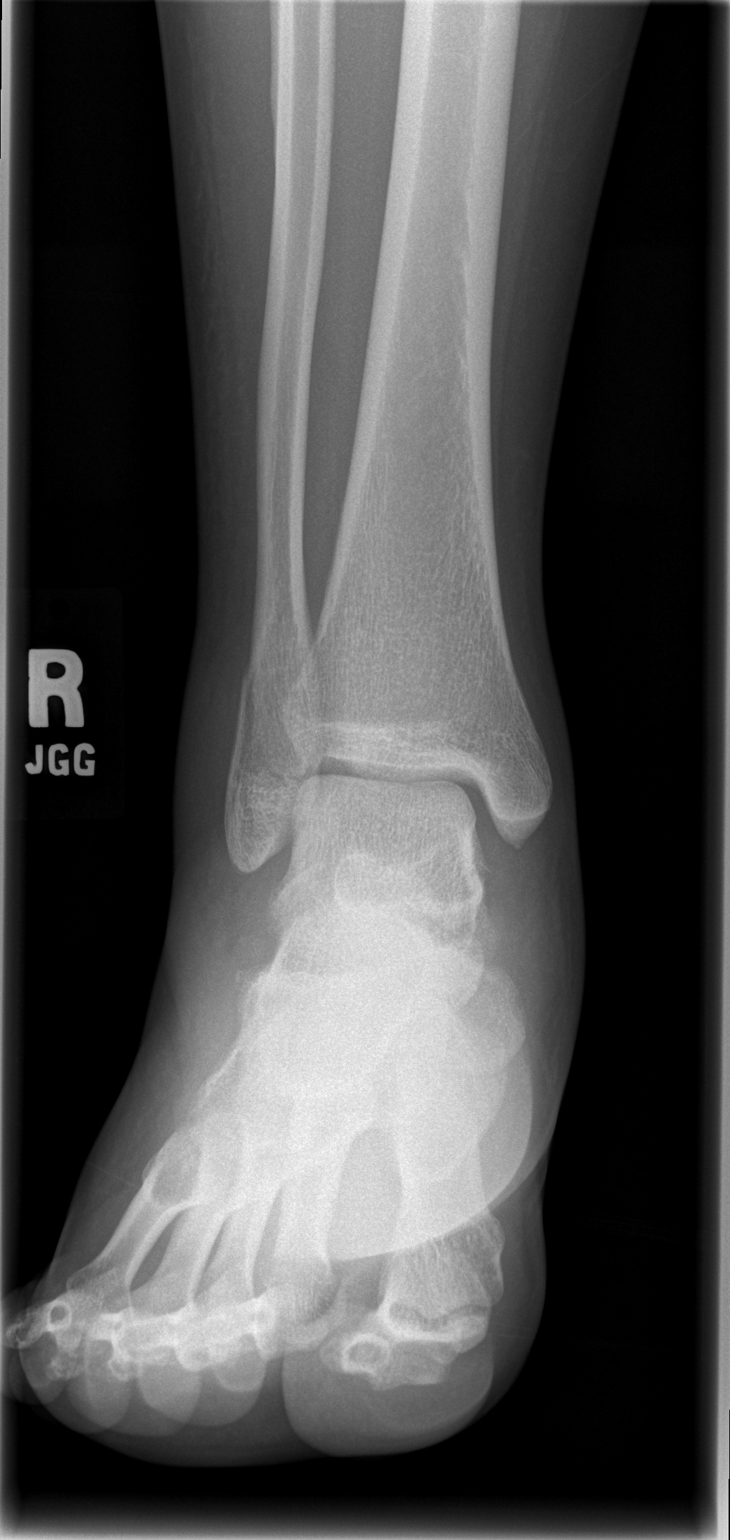

[t ankle joint oblique right]
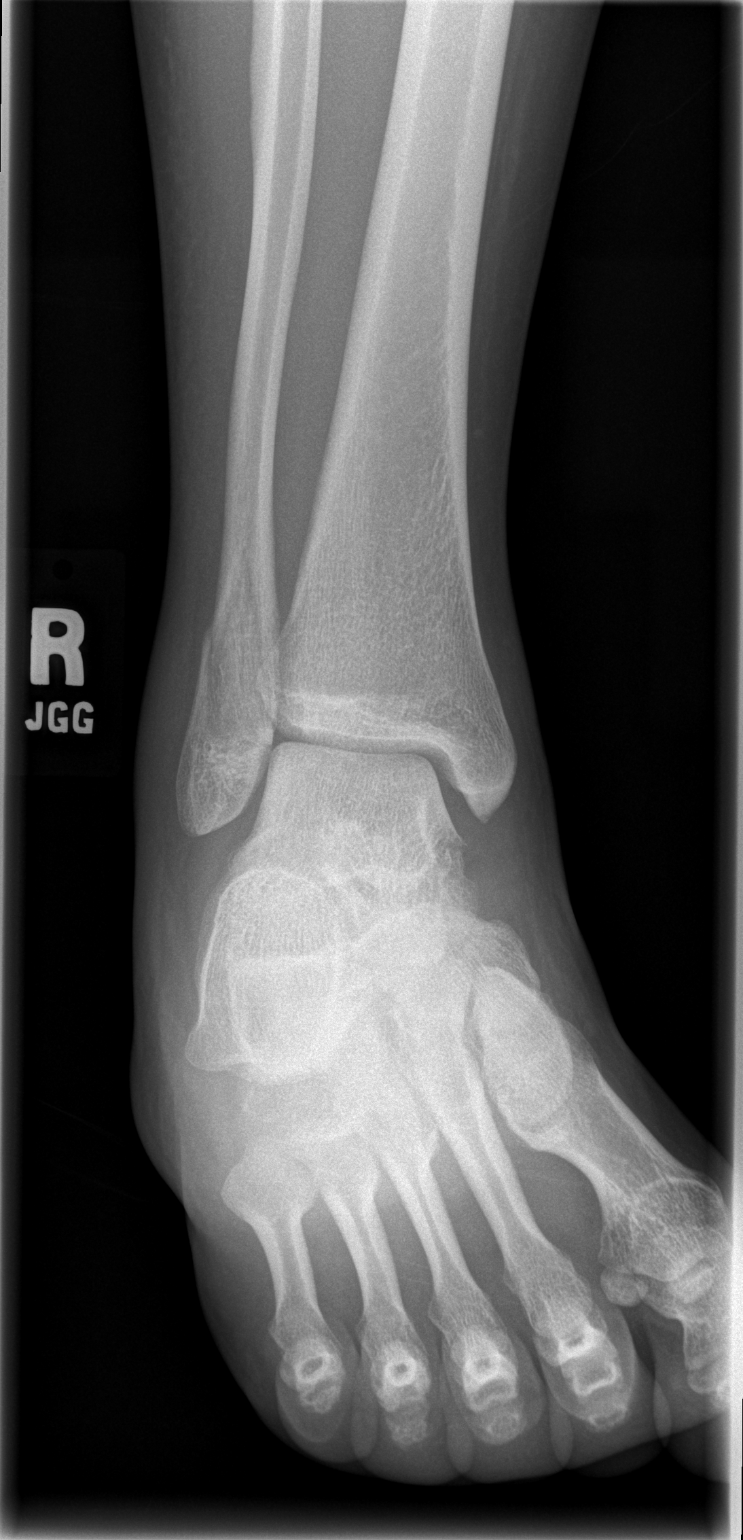

[t ankle joint lat right]
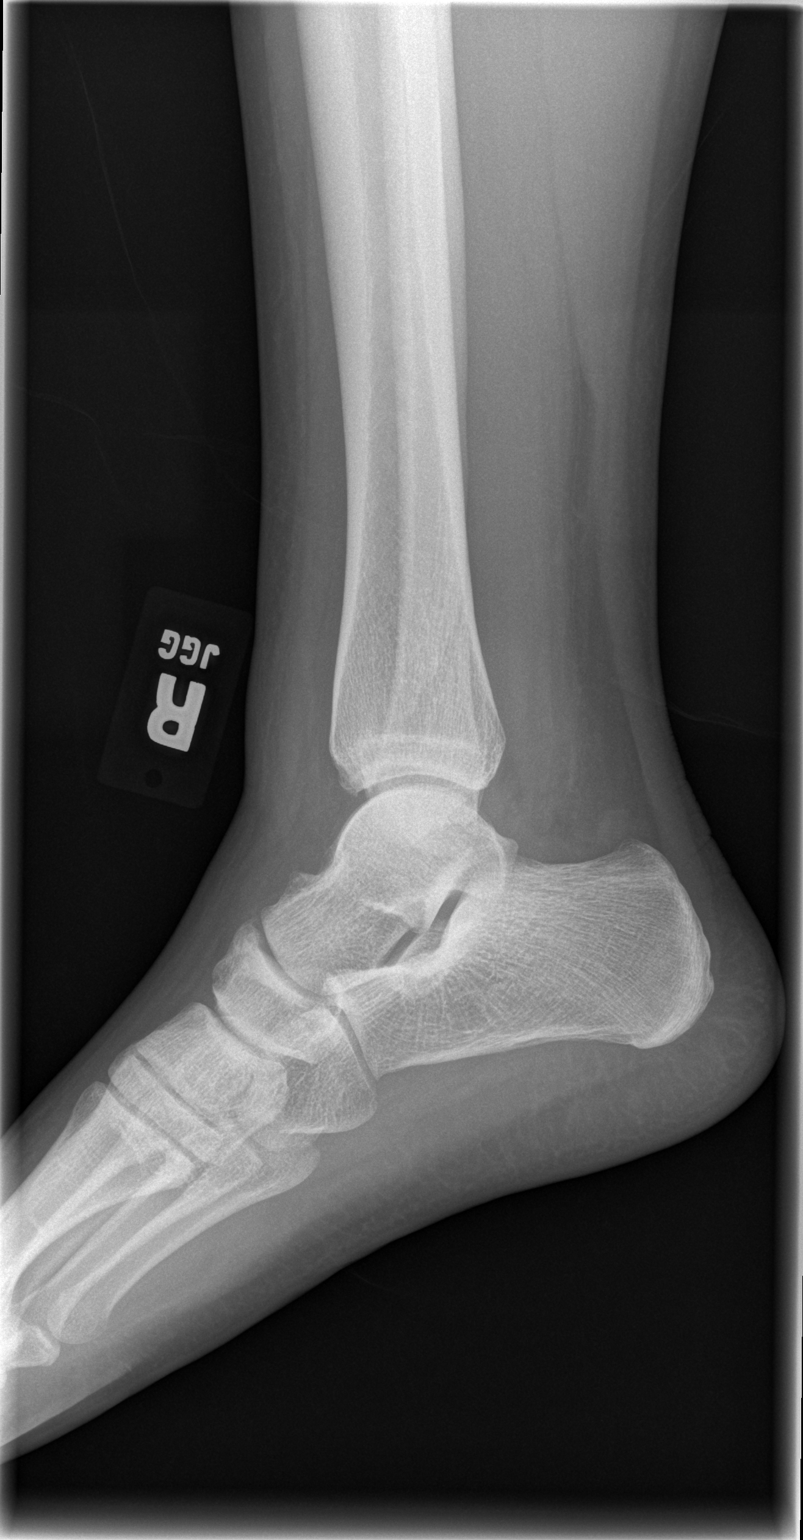

[3 of 3 positions shown; findings below may reference images not displayed]

FINDINGS: There is a nondisplaced oblique fracture of the distal fibular
metaphysis. No other regional fracture.
IMPRESSION: Nondisplaced oblique fracture of the distal fibular metaphysis.

## 2015-04-19 IMAGING — RF DG ANKLE COMPLETE 3+V*R*
1 series · 3 of 3 positions shown · non-contrast
Comparison: None.

CLINICAL DATA: Fracture fixation

EXAM:
RIGHT ANKLE - COMPLETE 3+ VIEW; DG C-ARM 61-120 MIN

[Series 1: run · 3 of 3 slices shown]
[im 1/3]
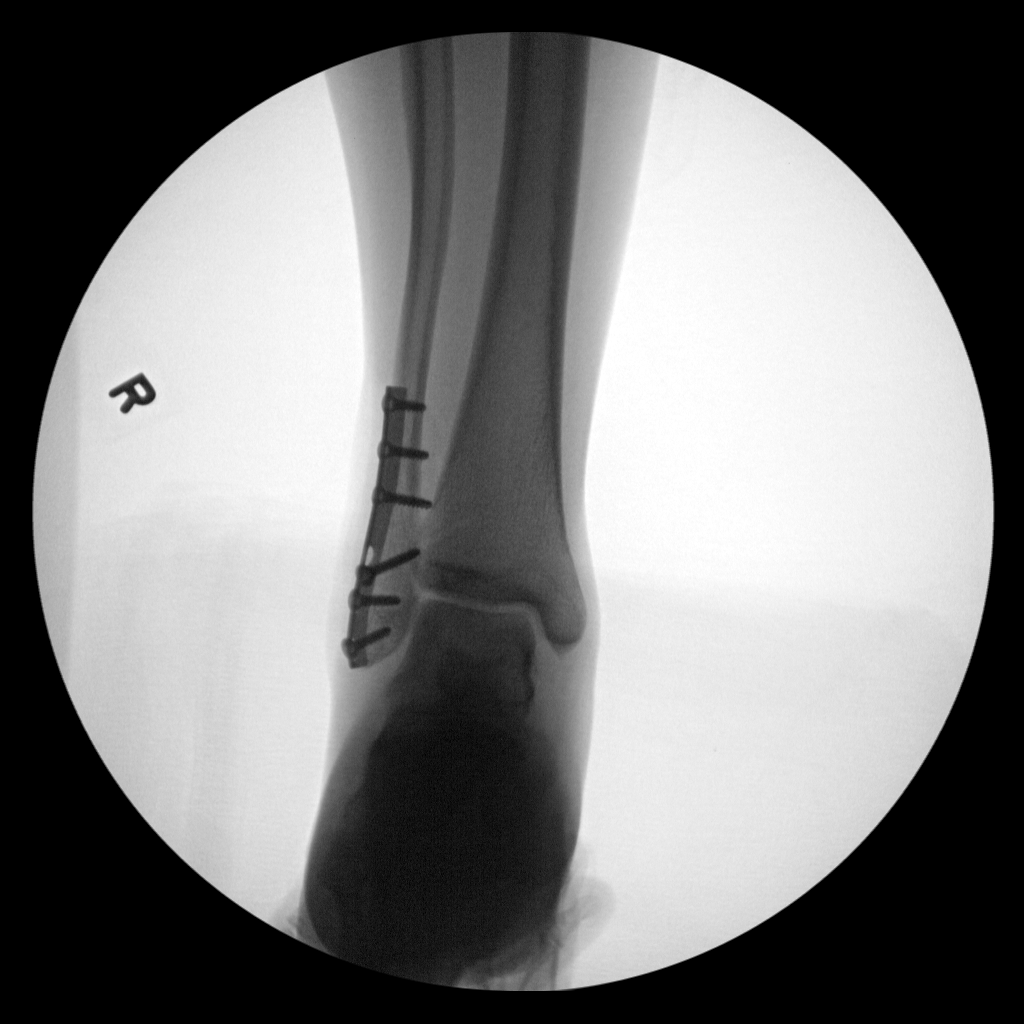
[im 2/3]
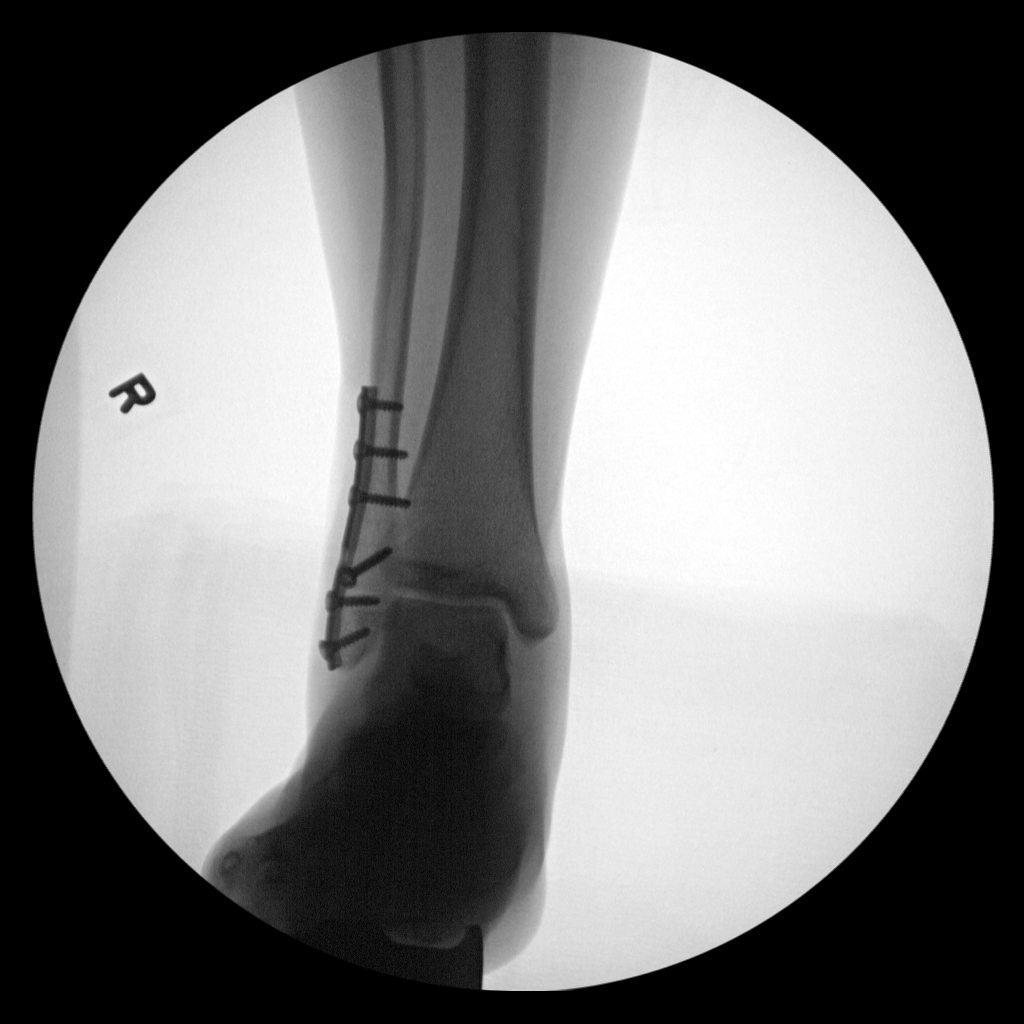
[im 3/3]
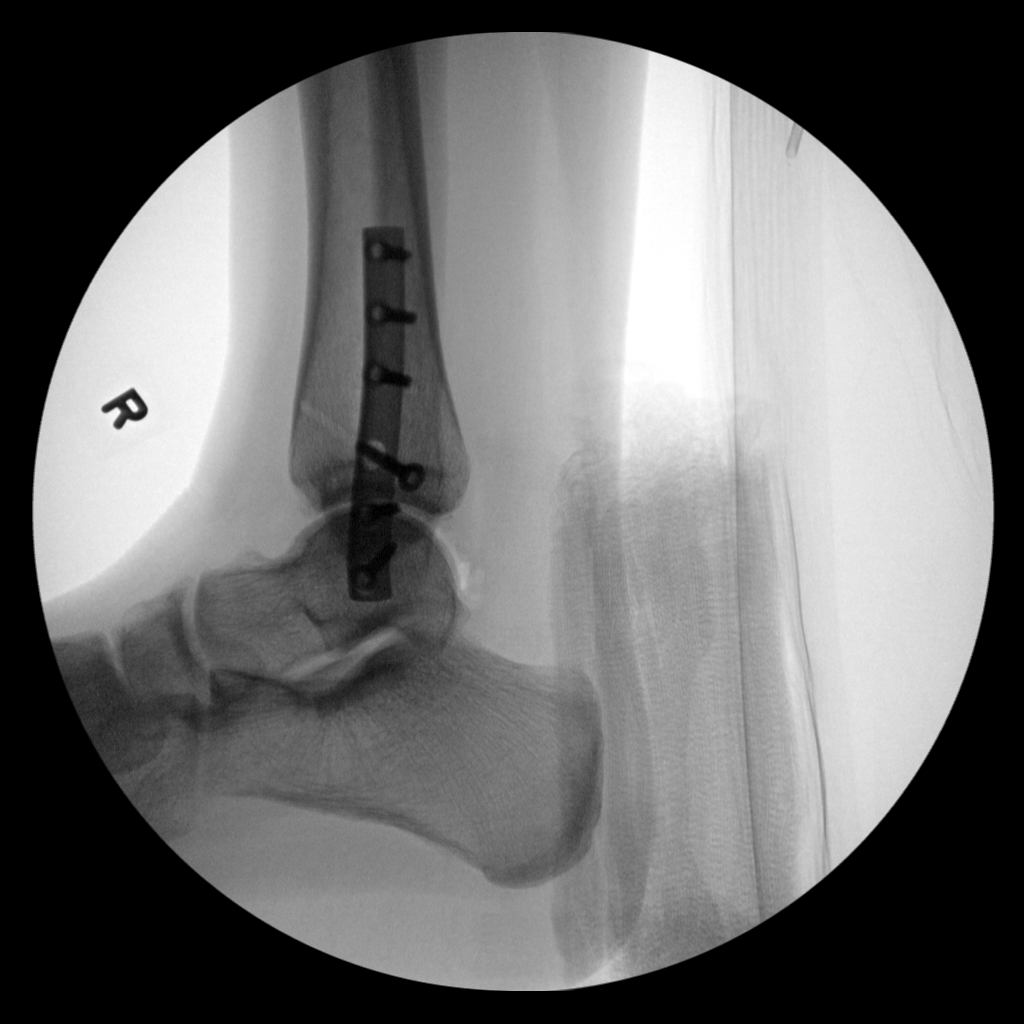

[3 of 3 positions shown; findings below may reference images not displayed]

FINDINGS: Plate and screws across the distal fibular fracture. Fracture
alignment is anatomic. No hardware on the tibia. Ankle joint is
normal.
IMPRESSION: Plate fixation of distal fibular fracture.
